# Patient Record
Sex: Male | Born: 1990 | Race: Black or African American | Hispanic: No | Marital: Single | Smoking: Never smoker
Health system: Southern US, Community
[De-identification: ages and names within clinical notes are randomized; demographics above are authoritative.]

## PROBLEM LIST (undated history)

## (undated) HISTORY — PX: MANDIBLE FRACTURE SURGERY: SHX706

---
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
## 2011-04-23 ENCOUNTER — Emergency Department (HOSPITAL_BASED_OUTPATIENT_CLINIC_OR_DEPARTMENT_OTHER)
Admission: EM | Admit: 2011-04-23 | Discharge: 2011-04-23 | Disposition: A | Discharge status: Home / Self Care | Payer: Self-pay | Attending: Emergency Medicine | Admitting: Emergency Medicine

## 2011-04-23 DIAGNOSIS — J069 Acute upper respiratory infection, unspecified: Secondary | ICD-10-CM | POA: Insufficient documentation

## 2011-04-23 DIAGNOSIS — B309 Viral conjunctivitis, unspecified: Secondary | ICD-10-CM | POA: Insufficient documentation

## 2011-04-23 MED ORDER — ERYTHROMYCIN 5 MG/GM OP OINT
TOPICAL_OINTMENT | Freq: Four times a day (QID) | OPHTHALMIC | Status: DC
  Administered 2011-04-23: 23:00:00 via OPHTHALMIC

## 2011-04-23 NOTE — ED Notes (Signed)
C/o sorethorat, runnny nose, cough, d/c from eyes x 3 days-NAD
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
## 2011-04-23 NOTE — ED Notes (Signed)
Pt given vial or erythromycin ointment TO GO and instructd on usage.

## 2011-04-23 NOTE — ED Provider Notes (Signed)
History     Chief Complaint  Patient presents with  . URI   The history is provided by the patient.  HPI This is a 20 year old male with a three-day history of upper respiratory infection symptoms. Specifically he has had eye redness and irritation, nasal congestion, nonproductive cough when he wakes up in the mornings. The symptoms in general or mild except for the eye symptoms which are moderate. He used an entire bottle of Visine at one time in an attempt to treat his eyes; this helped transiently. He had a subjective fever the first day but none since. History reviewed. No pertinent past medical history.  Past Surgical History  Procedure Date  . Mandible fracture surgery     No family history on file.  History  Substance Use Topics  . Smoking status: Never Smoker   . Smokeless tobacco: Not on file  . Alcohol Use: Yes      Review of Systems  All other systems reviewed and are negative.    Physical Exam  BP 109/67  Pulse 65  Temp(Src) 98.9 F (37.2 C) (Oral)  Resp 16  Ht 5\' 7"  (1.702 m)  Wt 162 lb (73.483 kg)  BMI 25.37 kg/m2  SpO2 100%  Physical Exam General: Well-developed, well-nourished male in no acute distress; appearance consistent with age of record HENT: normocephalic, atraumatic; no pharyngeal erythema or exudate Eyes: pupils equal round and reactive to light; extraocular muscles intact; conjunctival inflammation with serous discharge bilaterally Neck: supple Heart: regular rate and rhythm; no murmurs, rubs or gallops Lungs: clear to auscultation bilaterally Abdomen: soft; nontender; nondistended; no masses or hepatosplenomegaly; bowel sounds present Extremities: No deformity; full range of motion; pulses normal Neurologic: Awake, alert and oriented;motor function intact in all extremities and symmetric;sensation grossly intact; no facial droop Skin: Warm and dry Lymph: Left anterior cervical lymphadenopathy Psychiatric: Normal mood and  affect    ED Course  Procedures  MDM Patient is here primarily for his ophthalmologic symptoms. We will treat with antibiotic ointment. We have advised Visine used according to instructions or saline drops as needed. He was advised to use such drops prior to applying the ointment.      Hanley Seamen, MD 04/23/11 2312

## 2011-06-09 ENCOUNTER — Emergency Department (HOSPITAL_COMMUNITY)
Admission: EM | Admit: 2011-06-09 | Discharge: 2011-06-09 | Disposition: A | Discharge status: Home / Self Care | Payer: Self-pay | Attending: Emergency Medicine | Admitting: Emergency Medicine

## 2011-06-09 DIAGNOSIS — IMO0002 Reserved for concepts with insufficient information to code with codable children: Secondary | ICD-10-CM | POA: Insufficient documentation

## 2011-06-09 DIAGNOSIS — S20229A Contusion of unspecified back wall of thorax, initial encounter: Secondary | ICD-10-CM | POA: Insufficient documentation

## 2012-10-04 ENCOUNTER — Emergency Department (HOSPITAL_COMMUNITY)
Admission: EM | Admit: 2012-10-04 | Discharge: 2012-10-04 | Disposition: A | Discharge status: Home / Self Care | Payer: Self-pay | Attending: Emergency Medicine | Admitting: Emergency Medicine

## 2012-10-04 ENCOUNTER — Encounter (HOSPITAL_COMMUNITY): Payer: Self-pay | Admitting: *Deleted

## 2012-10-04 ENCOUNTER — Emergency Department (HOSPITAL_COMMUNITY): Payer: Self-pay

## 2012-10-04 DIAGNOSIS — Y939 Activity, unspecified: Secondary | ICD-10-CM | POA: Insufficient documentation

## 2012-10-04 DIAGNOSIS — Z23 Encounter for immunization: Secondary | ICD-10-CM | POA: Insufficient documentation

## 2012-10-04 DIAGNOSIS — T07XXXA Unspecified multiple injuries, initial encounter: Secondary | ICD-10-CM

## 2012-10-04 DIAGNOSIS — Y929 Unspecified place or not applicable: Secondary | ICD-10-CM | POA: Insufficient documentation

## 2012-10-04 DIAGNOSIS — S0990XA Unspecified injury of head, initial encounter: Secondary | ICD-10-CM | POA: Insufficient documentation

## 2012-10-04 DIAGNOSIS — IMO0002 Reserved for concepts with insufficient information to code with codable children: Secondary | ICD-10-CM | POA: Insufficient documentation

## 2012-10-04 MED ORDER — TETANUS-DIPHTH-ACELL PERTUSSIS 5-2.5-18.5 LF-MCG/0.5 IM SUSP
0.5000 mL | Freq: Once | INTRAMUSCULAR | Status: AC
  Administered 2012-10-04: 0.5 mL via INTRAMUSCULAR

## 2012-10-04 MED ORDER — TETANUS-DIPHTH-ACELL PERTUSSIS 5-2.5-18.5 LF-MCG/0.5 IM SUSP
INTRAMUSCULAR | Status: AC
  Filled 2012-10-04: qty 0.5

## 2012-10-04 MED ORDER — IBUPROFEN 400 MG PO TABS
600.0000 mg | ORAL_TABLET | Freq: Once | ORAL | Status: AC
  Administered 2012-10-04: 600 mg via ORAL
  Filled 2012-10-04: qty 2

## 2012-10-04 MED ORDER — OXYCODONE-ACETAMINOPHEN 5-325 MG PO TABS
1.0000 | ORAL_TABLET | Freq: Once | ORAL | Status: AC
  Administered 2012-10-04: 1 via ORAL
  Filled 2012-10-04: qty 1
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
## 2012-10-04 NOTE — ED Notes (Signed)
ED MD at bedside. C-collar removed

## 2012-10-04 NOTE — ED Notes (Signed)
Patient transported to CT 
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
## 2013-07-09 ENCOUNTER — Emergency Department (HOSPITAL_COMMUNITY)
Admission: EM | Admit: 2013-07-09 | Discharge: 2013-07-09 | Disposition: A | Discharge status: Home / Self Care | Payer: Self-pay | Attending: Emergency Medicine | Admitting: Emergency Medicine

## 2013-07-09 ENCOUNTER — Encounter (HOSPITAL_COMMUNITY): Payer: Self-pay | Admitting: Emergency Medicine

## 2013-07-09 ENCOUNTER — Emergency Department (HOSPITAL_COMMUNITY): Payer: Self-pay

## 2013-07-09 DIAGNOSIS — Z23 Encounter for immunization: Secondary | ICD-10-CM | POA: Insufficient documentation

## 2013-07-09 DIAGNOSIS — Y939 Activity, unspecified: Secondary | ICD-10-CM | POA: Insufficient documentation

## 2013-07-09 DIAGNOSIS — S81812A Laceration without foreign body, left lower leg, initial encounter: Secondary | ICD-10-CM

## 2013-07-09 DIAGNOSIS — W298XXA Contact with other powered powered hand tools and household machinery, initial encounter: Secondary | ICD-10-CM | POA: Insufficient documentation

## 2013-07-09 DIAGNOSIS — S81009A Unspecified open wound, unspecified knee, initial encounter: Secondary | ICD-10-CM | POA: Insufficient documentation

## 2013-07-09 DIAGNOSIS — Y929 Unspecified place or not applicable: Secondary | ICD-10-CM | POA: Insufficient documentation

## 2013-07-09 MED ORDER — TETANUS-DIPHTH-ACELL PERTUSSIS 5-2.5-18.5 LF-MCG/0.5 IM SUSP
0.5000 mL | Freq: Once | INTRAMUSCULAR | Status: AC
  Administered 2013-07-09: 0.5 mL via INTRAMUSCULAR
  Filled 2013-07-09: qty 0.5

## 2013-07-09 MED ORDER — HYDROCODONE-ACETAMINOPHEN 5-325 MG PO TABS: 1.0000 | ORAL_TABLET | ORAL | Status: DC | PRN

## 2013-07-09 MED ORDER — HYDROCODONE-ACETAMINOPHEN 5-325 MG PO TABS
1.0000 | ORAL_TABLET | Freq: Once | ORAL | Status: AC
  Administered 2013-07-09: 1 via ORAL
  Filled 2013-07-09: qty 1

## 2013-07-09 NOTE — ED Notes (Signed)
Dr. Ranae Palms at the bedside. Dressings removed from left medial aspect of mid calf and foot. Abrasion with superficial lacerations to medial aspect of left foot. Moderate bleeding from left calf. Wet to dry dressing applied by tech at this time.

## 2013-07-09 NOTE — ED Notes (Signed)
Pt reports that he was cutting limbs in a tree and slipped. Reports that the chainsaw hit him on the left calf. Pt with bleeding at triage. Pt reports that the cables he was wearing stopped him from falling very far. Pt reports sensation in the foot.

## 2013-07-09 NOTE — ED Notes (Signed)
Pt. Desires no add. Pain med. Has a high pain tolerance.

## 2013-07-09 NOTE — ED Notes (Signed)
Resident at bedside to suture wounds.

## 2013-07-09 NOTE — ED Provider Notes (Signed)
CSN: [id number]     Arrival date & time 07/09/13  1754 History   First MD Initiated Contact with Patient 07/09/13 1808     Chief Complaint  Patient presents with  . Laceration   (Consider location/radiation/quality/duration/timing/severity/associated sxs/prior Treatment) HPI The patient sustained a laceration to his left lower leg today after dropping a chain saw. The injury occurred roughly 6 hours prior to presentation. He continues to have some bleeding but the wound is now clotted. He denies any sensory loss or decreased movement of his lower extremity. He is unsure of his last tetanus shot. He denies any head or neck trauma. History reviewed. No pertinent past medical history. Past Surgical History  Procedure Laterality Date  . Mandible fracture surgery     History reviewed. No pertinent family history. History  Substance Use Topics  . Smoking status: Never Smoker   . Smokeless tobacco: Not on file  . Alcohol Use: Yes    Review of Systems  Constitutional: Negative for fever and chills.  Eyes: Negative for visual disturbance.  Respiratory: Negative for shortness of breath.   Cardiovascular: Negative for chest pain.  Gastrointestinal: Negative for nausea, vomiting, abdominal pain and diarrhea.  Musculoskeletal: Negative for back pain, myalgias and neck pain.  Skin: Positive for wound.  Neurological: Negative for dizziness, syncope, weakness, light-headedness, numbness and headaches.  All other systems reviewed and are negative.    Allergies  Review of patient's allergies indicates no known allergies.  Home Medications  No current outpatient prescriptions on file. BP 125/80  Pulse 76  Temp(Src) 98.5 F (36.9 C) (Oral)  Resp 16  SpO2 98% Physical Exam  Nursing note and vitals reviewed. Constitutional: He is oriented to person, place, and time. He appears well-developed and well-nourished. No distress.  HENT:  Head: Normocephalic and atraumatic.  Mouth/Throat:  Oropharynx is clear and moist.  Eyes: EOM are normal. Pupils are equal, round, and reactive to light.  Neck: Normal range of motion. Neck supple.  No posterior cervical spine tenderness to palpation.  Cardiovascular: Normal rate and regular rhythm.   Pulmonary/Chest: Effort normal and breath sounds normal. No respiratory distress. He has no wheezes. He has no rales.  Abdominal: Soft. Bowel sounds are normal. He exhibits no distension and no mass. There is no tenderness. There is no rebound and no guarding.  Musculoskeletal: Normal range of motion. He exhibits no edema and no tenderness.  Dorsalis pedis and posterior tibial pulses are fully intact.  Neurological: He is alert and oriented to person, place, and time.  Good strength in dorsi and plantar flexion of the left foot. Sensation is fully intact. No other motor or sensory deficits.  Skin: Skin is warm and dry. No rash noted. No erythema.  Patient has roughly 6 cm irregular laceration to his medial surface of his left lower extremity approximately the level of the gastrocnemius. It is vertically oriented. Clot has formed though there continues to be some mild bleeding. He has no obvious contamination exam. Distal to the laceration is small amount of the macerated skin on the medial surface of his left foot. No active bleeding or underlying deformity.  Psychiatric: He has a normal mood and affect. His behavior is normal.    ED Course  Procedures (including critical care time) Labs Review Labs Reviewed - No data to display Imaging Review No results found.  EKG Interpretation   None       MDM    This with no bony injury on x-ray. Laceration explored  irrigated and repaired by the resident. See resident procedure note. Patient advised to return immediately for any evidence of infection. He should be seen in 24-48 hours for wound check of the area. He also needs to have the sutures removed in 7-10 days. Patient voices  understanding.  Loren Racer, MD 07/09/13 2200

## 2013-08-04 ENCOUNTER — Emergency Department (HOSPITAL_COMMUNITY)
Admission: EM | Admit: 2013-08-04 | Discharge: 2013-08-04 | Disposition: A | Discharge status: Home / Self Care | Payer: Self-pay | Attending: Emergency Medicine | Admitting: Emergency Medicine

## 2013-08-04 ENCOUNTER — Emergency Department (HOSPITAL_COMMUNITY): Payer: Self-pay

## 2013-08-04 ENCOUNTER — Encounter (HOSPITAL_COMMUNITY): Payer: Self-pay | Admitting: Emergency Medicine

## 2013-08-04 DIAGNOSIS — S62009A Unspecified fracture of navicular [scaphoid] bone of unspecified wrist, initial encounter for closed fracture: Secondary | ICD-10-CM | POA: Insufficient documentation

## 2013-08-04 DIAGNOSIS — IMO0002 Reserved for concepts with insufficient information to code with codable children: Secondary | ICD-10-CM | POA: Insufficient documentation

## 2013-08-04 DIAGNOSIS — S62001A Unspecified fracture of navicular [scaphoid] bone of right wrist, initial encounter for closed fracture: Secondary | ICD-10-CM

## 2013-08-04 DIAGNOSIS — Y9389 Activity, other specified: Secondary | ICD-10-CM | POA: Insufficient documentation

## 2013-08-04 DIAGNOSIS — Y929 Unspecified place or not applicable: Secondary | ICD-10-CM | POA: Insufficient documentation

## 2013-08-04 MED ORDER — HYDROCODONE-ACETAMINOPHEN 5-325 MG PO TABS
2.0000 | ORAL_TABLET | Freq: Once | ORAL | Status: AC
  Administered 2013-08-04: 2 via ORAL
  Filled 2013-08-04: qty 2

## 2013-08-04 MED ORDER — HYDROCODONE-ACETAMINOPHEN 5-325 MG PO TABS: 1.0000 | ORAL_TABLET | Freq: Four times a day (QID) | ORAL | Status: DC | PRN

## 2013-08-04 MED ORDER — IBUPROFEN 800 MG PO TABS
800.0000 mg | ORAL_TABLET | Freq: Once | ORAL | Status: AC
  Administered 2013-08-04: 800 mg via ORAL
  Filled 2013-08-04: qty 1

## 2013-08-04 MED ORDER — IBUPROFEN 600 MG PO TABS: 600.0000 mg | ORAL_TABLET | Freq: Four times a day (QID) | ORAL | Status: AC | PRN
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
## 2013-08-04 NOTE — Progress Notes (Signed)
[facility] provided pt with a list of primary care resources.  °

## 2013-08-06 NOTE — ED Provider Notes (Signed)
Medical screening examination/treatment/procedure(s) were performed by non-physician practitioner and as supervising physician I was immediately available for consultation/collaboration.  EKG Interpretation   None        Milina Pagett, MD 08/06/13 1513 

## 2013-08-12 NOTE — ED Provider Notes (Signed)
I saw and evaluated the patient, reviewed the resident's note and I agree with the findings and plan. I was present and supervised the key portions of the procedure  EKG Interpretation   None        Loren Racer, MD 08/12/13 2344

## 2013-08-12 NOTE — ED Provider Notes (Signed)
LACERATION REPAIR Performed by: Noel Gerold Authorized by: Noel Gerold Consent: Verbal consent obtained. Risks and benefits: risks, benefits and alternatives were discussed Consent given by: patient Patient identity confirmed: provided demographic data Prepped and Draped in normal sterile fashion Wound explored  Laceration Location:L calf:  Laceration Length:6cm As well as 1cm over L heel  No Foreign Bodies seen or palpated  Anesthesia: local infiltration  Local anesthetic: lidocaine 1% with epinephrine  Anesthetic total: 15 ml  Irrigation method: syringe Amount of cleaning: extensive  Skin closure: prolene  Technique: Simple interrupted  Patient tolerance: Patient tolerated the procedure well with no immediate complications.   Procedure note added as late entry due to errantly unentered documentation  Noel Gerold, MD 08/12/13 1057

## 2014-03-17 ENCOUNTER — Emergency Department (HOSPITAL_COMMUNITY): Payer: Self-pay

## 2014-03-17 ENCOUNTER — Encounter (HOSPITAL_COMMUNITY): Payer: Self-pay | Admitting: Emergency Medicine

## 2014-03-17 ENCOUNTER — Emergency Department (HOSPITAL_COMMUNITY)
Admission: EM | Admit: 2014-03-17 | Discharge: 2014-03-17 | Disposition: A | Discharge status: Home / Self Care | Payer: Self-pay | Attending: Emergency Medicine | Admitting: Emergency Medicine

## 2014-03-17 DIAGNOSIS — Y9389 Activity, other specified: Secondary | ICD-10-CM | POA: Insufficient documentation

## 2014-03-17 DIAGNOSIS — S9030XA Contusion of unspecified foot, initial encounter: Secondary | ICD-10-CM | POA: Insufficient documentation

## 2014-03-17 DIAGNOSIS — S9032XA Contusion of left foot, initial encounter: Secondary | ICD-10-CM

## 2014-03-17 DIAGNOSIS — Y92410 Unspecified street and highway as the place of occurrence of the external cause: Secondary | ICD-10-CM | POA: Insufficient documentation

## 2014-03-17 DIAGNOSIS — S92919A Unspecified fracture of unspecified toe(s), initial encounter for closed fracture: Secondary | ICD-10-CM | POA: Insufficient documentation

## 2014-03-17 DIAGNOSIS — T148XXA Other injury of unspecified body region, initial encounter: Secondary | ICD-10-CM

## 2014-03-17 MED ORDER — HYDROCODONE-ACETAMINOPHEN 5-325 MG PO TABS: 1.0000 | ORAL_TABLET | ORAL | Status: DC | PRN

## 2014-03-17 MED ORDER — IBUPROFEN 800 MG PO TABS: 800.0000 mg | ORAL_TABLET | Freq: Three times a day (TID) | ORAL | Status: AC | PRN
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
## 2014-03-17 NOTE — Discharge Instructions (Signed)
Read the information below.  Use the prescribed medication as directed.  Please discuss all new medications with your pharmacist.  Do not take additional tylenol while taking the prescribed pain medication to avoid overdose.  You may return to the Emergency Department at any time for worsening condition or any new symptoms that concern you.  If you develop uncontrolled pain, weakness or numbness of the extremity, severe discoloration of the skin, or you are unable to walk, return to the ER for a recheck.      Foot Contusion A foot contusion is a deep bruise to the foot. Contusions are the result of an injury that caused bleeding under the skin. The contusion may turn blue, purple, or yellow. Minor injuries will give you a painless contusion, but more severe contusions may stay painful and swollen for a few weeks. CAUSES  A foot contusion comes from a direct blow to that area, such as a heavy object falling on the foot. SYMPTOMS   Swelling of the foot.  Discoloration of the foot.  Tenderness or soreness of the foot. DIAGNOSIS  You will have a physical exam and will be asked about your history. You may need an X-ray of your foot to look for a broken bone (fracture).  TREATMENT  An elastic wrap may be recommended to support your foot. Resting, elevating, and applying cold compresses to your foot are often the best treatments for a foot contusion. Over-the-counter medicines may also be recommended for pain control. HOME CARE INSTRUCTIONS   Put ice on the injured area.  Put ice in a plastic bag.  Place a towel between your skin and the bag.  Leave the ice on for 15-20 minutes, 03-04 times a day.  Only take over-the-counter or prescription medicines for pain, discomfort, or fever as directed by your caregiver.  If told, use an elastic wrap as directed. This can help reduce swelling. You may remove the wrap for sleeping, showering, and bathing. If your toes become numb, cold, or blue, take the  wrap off and reapply it more loosely.  Elevate your foot with pillows to reduce swelling.  Try to avoid standing or walking while the foot is painful. Do not resume use until instructed by your caregiver. Then, begin use gradually. If pain develops, decrease use. Gradually increase activities that do not cause discomfort until you have normal use of your foot.  See your caregiver as directed. It is very important to keep all follow-up appointments in order to avoid any lasting problems with your foot, including long-term (chronic) pain. SEEK IMMEDIATE MEDICAL CARE IF:   You have increased redness, swelling, or pain in your foot.  Your swelling or pain is not relieved with medicines.  You have loss of feeling in your foot or are unable to move your toes.  Your foot turns cold or blue.  You have pain when you move your toes.  Your foot becomes warm to the touch.  Your contusion does not improve in 2 days. MAKE SURE YOU:   Understand these instructions.  Will watch your condition.  Will get help right away if you are not doing well or get worse. Document Released: 07/01/2006 Document Revised: 03/10/2012 Document Reviewed: 08/13/2011 [employer] Patient Information 2015 [employer], [place]. This information is not intended to replace advice given to you by your health care provider. Make sure you discuss any questions you have with your health care provider.

## 2014-03-21 NOTE — ED Provider Notes (Signed)
Medical screening examination/treatment/procedure(s) were performed by non-physician practitioner and as supervising physician I was immediately available for consultation/collaboration.   EKG Interpretation None        Mark James, MD 03/21/14 2142 

## 2015-05-10 ENCOUNTER — Emergency Department (HOSPITAL_COMMUNITY)
Admission: EM | Admit: 2015-05-10 | Discharge: 2015-05-10 | Disposition: A | Discharge status: Home / Self Care | Payer: Self-pay | Attending: Emergency Medicine | Admitting: Emergency Medicine

## 2015-05-10 ENCOUNTER — Encounter (HOSPITAL_COMMUNITY): Payer: Self-pay | Admitting: *Deleted

## 2015-05-10 ENCOUNTER — Emergency Department (HOSPITAL_COMMUNITY): Payer: Self-pay

## 2015-05-10 DIAGNOSIS — S60512A Abrasion of left hand, initial encounter: Secondary | ICD-10-CM | POA: Insufficient documentation

## 2015-05-10 DIAGNOSIS — S6992XA Unspecified injury of left wrist, hand and finger(s), initial encounter: Secondary | ICD-10-CM

## 2015-05-10 DIAGNOSIS — Y939 Activity, unspecified: Secondary | ICD-10-CM | POA: Insufficient documentation

## 2015-05-10 DIAGNOSIS — W2209XA Striking against other stationary object, initial encounter: Secondary | ICD-10-CM | POA: Insufficient documentation

## 2015-05-10 DIAGNOSIS — Y929 Unspecified place or not applicable: Secondary | ICD-10-CM | POA: Insufficient documentation

## 2015-05-10 DIAGNOSIS — Y999 Unspecified external cause status: Secondary | ICD-10-CM | POA: Insufficient documentation

## 2015-05-10 MED ORDER — OXYCODONE-ACETAMINOPHEN 5-325 MG PO TABS: 2.0000 | ORAL_TABLET | ORAL | Status: AC | PRN

## 2015-05-10 NOTE — Discharge Instructions (Signed)
Please use ibuprofen, Tylenol, ice as needed for pain. Please avoid any heavy lifting or strenuous activities to require grip strength. Please use medication only as directed, do not use well working, driving, drinking. Please follow-up with primary care provider if symptoms continue to persist or worsen.

## 2015-05-10 NOTE — ED Notes (Signed)
Pt refused dressing at time of discharge, sent home with bandages, ace wrap, bacitracin and ice pack in bag. Pt left at this time with all belongings.

## 2015-05-10 NOTE — ED Notes (Signed)
Patient transported to X-ray 

## 2015-05-10 NOTE — ED Notes (Signed)
Patient returned from xray.
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
## 2016-08-22 ENCOUNTER — Emergency Department (HOSPITAL_COMMUNITY)
Admission: EM | Admit: 2016-08-22 | Discharge: 2016-08-22 | Disposition: A | Discharge status: Home / Self Care | Payer: Self-pay | Attending: Emergency Medicine | Admitting: Emergency Medicine

## 2016-08-22 ENCOUNTER — Encounter (HOSPITAL_COMMUNITY): Payer: Self-pay

## 2016-08-22 ENCOUNTER — Emergency Department (HOSPITAL_COMMUNITY): Payer: Self-pay

## 2016-08-22 DIAGNOSIS — Z79899 Other long term (current) drug therapy: Secondary | ICD-10-CM | POA: Insufficient documentation

## 2016-08-22 DIAGNOSIS — Y999 Unspecified external cause status: Secondary | ICD-10-CM | POA: Insufficient documentation

## 2016-08-22 DIAGNOSIS — Y9389 Activity, other specified: Secondary | ICD-10-CM | POA: Insufficient documentation

## 2016-08-22 DIAGNOSIS — Y929 Unspecified place or not applicable: Secondary | ICD-10-CM | POA: Insufficient documentation

## 2016-08-22 DIAGNOSIS — W208XXA Other cause of strike by thrown, projected or falling object, initial encounter: Secondary | ICD-10-CM | POA: Insufficient documentation

## 2016-08-22 DIAGNOSIS — S52392A Other fracture of shaft of radius, left arm, initial encounter for closed fracture: Secondary | ICD-10-CM | POA: Insufficient documentation

## 2016-08-22 MED ORDER — HYDROCODONE-ACETAMINOPHEN 5-325 MG PO TABS: 1.0000 | ORAL_TABLET | Freq: Four times a day (QID) | ORAL | 0 refills | Status: AC | PRN

## 2016-08-22 NOTE — ED Provider Notes (Signed)
[facility]-EMERGENCY DEPT Provider Note   CSN: [id number] Arrival date & time: 08/22/16  1037     History   Chief Complaint Chief Complaint  Patient presents with  . Arm Pain    HPI [name] is a 25 y.o. male.  Patient cutting tree while elevated in a bucket truck yesterday. Tree fell and struck patient in the left forearm while arm was supported on ledge of bucket. Patient with mid-arm pain. No deformity.    The history is provided by the patient. No language interpreter was used.  Arm Injury   This is a new problem. The current episode started yesterday. The problem has not changed since onset.The pain is present in the left arm. The quality of the pain is described as aching. The pain is mild. Associated symptoms include limited range of motion. There has been a history of trauma.    History reviewed. No pertinent past medical history.  There are no active problems to display for this patient.   Past Surgical History:  Procedure Laterality Date  . MANDIBLE FRACTURE SURGERY         Home Medications    Prior to Admission medications   Medication Sig Start Date End Date Taking? Authorizing Provider  diphenhydrAMINE (BENADRYL) 25 mg capsule Take 25-50 mg by mouth at bedtime as needed for sleep.    Historical Provider, MD  HYDROcodone-acetaminophen (NORCO/VICODIN) 5-325 MG per tablet Take 1-2 tablets by mouth every 6 (six) hours as needed for moderate pain or severe pain. 08/04/13   Junius FinnerErin O'Malley, PA-C  HYDROcodone-acetaminophen (NORCO/VICODIN) 5-325 MG per tablet Take 1 tablet by mouth every 4 (four) hours as needed for moderate pain or severe pain. 03/17/14   Trixie DredgeEmily West, PA-C  ibuprofen (ADVIL,MOTRIN) 600 MG tablet Take 1 tablet (600 mg total) by mouth every 6 (six) hours as needed. 08/04/13   Junius FinnerErin O'Malley, PA-C  ibuprofen (ADVIL,MOTRIN) 800 MG tablet Take 1 tablet (800 mg total) by mouth every 8 (eight) hours as needed for mild pain or moderate pain. 03/17/14   Trixie DredgeEmily  West, PA-C  oxyCODONE-acetaminophen (PERCOCET/ROXICET) 5-325 MG per tablet Take 2 tablets by mouth every 4 (four) hours as needed for severe pain. 05/10/15   Eyvonne MechanicJeffrey Hedges, PA-C    Family History History reviewed. No pertinent family history.  Social History Social History  Substance Use Topics  . Smoking status: Never Smoker  . Smokeless tobacco: Never Used  . Alcohol use Yes     Allergies   Patient has no known allergies.   Review of Systems Review of Systems  Musculoskeletal: Positive for arthralgias.  All other systems reviewed and are negative.    Physical Exam Updated Vital Signs BP 123/73 (BP Location: Right Arm)   Pulse 85   Temp 98.3 F (36.8 C) (Oral)   Resp 14   Ht 5\' 8"  (1.727 m)   Wt 73.5 kg   SpO2 98%   BMI 24.63 kg/m   Physical Exam  Constitutional: He is oriented to person, place, and time. He appears well-developed and well-nourished.  Eyes: Conjunctivae are normal.  Neck: Neck supple.  Cardiovascular: Normal rate and regular rhythm.   Pulmonary/Chest: Effort normal and breath sounds normal.  Abdominal: Soft. Bowel sounds are normal.  Musculoskeletal: He exhibits tenderness.       Arms: Neurological: He is alert and oriented to person, place, and time.  Skin: Skin is warm and dry. Capillary refill takes less than 2 seconds.  Psychiatric: He has a normal mood and  affect.  Nursing note and vitals reviewed.    ED Treatments / Results  Labs (all labs ordered are listed, but only abnormal results are displayed) Labs Reviewed - No data to display  EKG  EKG Interpretation None       Radiology Dg Forearm Left  Result Date: 08/22/2016 CLINICAL DATA:  Injured operating chainsaw last night with pain in the mid forearm EXAM: LEFT FOREARM - 2 VIEW COMPARISON:  None. FINDINGS: There is a nondisplaced fracture through the mid distal left radius. The ulna is intact. No opacity within the soft tissues is seen. IMPRESSION: Nondisplaced transverse  fracture through the mid distal left radius. Electronically Signed   By: Dwyane DeePaul  Barry M.D.   On: 08/22/2016 11:35    Procedures Procedures (including critical care time)  Medications Ordered in ED Medications - No data to display   Initial Impression / Assessment and Plan / ED Course  I have reviewed the triage vital signs and the nursing notes.  Pertinent labs & imaging results that were available during my care of the patient were reviewed by me and considered in my medical decision making (see chart for details).  Clinical Course   Patient X-Ray positive for mid-shaft left radius fracture. Splinted in the ED.  Pt advised to follow up with orthopedics. Care instructions provided. Patient will be discharged home & is agreeable with above plan. Returns precautions discussed. Pt appears safe for discharge.    Final Clinical Impressions(s) / ED Diagnoses   Final diagnoses:  Other closed fracture of shaft of left radius, initial encounter    New Prescriptions Discharge Medication List as of 08/22/2016  1:05 PM       Felicie Mornavid Maxcine Strong, NP 08/22/16 1617    Laurence Spatesachel Morgan Little, MD 08/23/16 [phone]

## 2016-08-22 NOTE — ED Notes (Signed)
Bed: WTR8 Expected date:  Expected time:  Means of arrival:  Comments: 

## 2016-08-22 NOTE — ED Triage Notes (Signed)
Pt was using chainsaw yesterday.  Ran out of gas.  Arm was pinned in hard spot.  Pain in left forearm.  Able to move fingers.  No obvious deformity noted.  States pain is better with elevation.

## 2016-09-03 ENCOUNTER — Emergency Department (HOSPITAL_COMMUNITY)
Admission: EM | Admit: 2016-09-03 | Discharge: 2016-09-03 | Disposition: A | Discharge status: Home / Self Care | Payer: Self-pay | Attending: Emergency Medicine | Admitting: Emergency Medicine

## 2016-09-03 ENCOUNTER — Emergency Department (HOSPITAL_COMMUNITY): Payer: Self-pay

## 2016-09-03 DIAGNOSIS — S52502D Unspecified fracture of the lower end of left radius, subsequent encounter for closed fracture with routine healing: Secondary | ICD-10-CM | POA: Insufficient documentation

## 2016-09-03 DIAGNOSIS — W228XXD Striking against or struck by other objects, subsequent encounter: Secondary | ICD-10-CM | POA: Insufficient documentation

## 2016-09-03 NOTE — ED Notes (Signed)
Pt states that he broke his arm and was seen here on 11/30. [facility] and said that he needed to go to hospital and have nurse/doctor page Dr. Amanda PeaGramig regarding having plates and screws placed in his arm.

## 2016-09-03 NOTE — Consult Note (Signed)
Reason for Consult: Forearm fracture Referring Physician: ER staff  Clide DalesQuame R Sorci is an 25 y.o. male.  HPI: 1927 rolled male status post injury November 30. The patient sustained a blow to his left forearm with a nondisplaced radius fracture and a normal styloid fracture. The patient is been in a sugar tong splint. He is now 2 weeks out and is here for evaluation.  I reviewed all issues at length.  This is a very active young man aged 25 he does not smoke or drink. He is very healthy and has mild pain at best in the form of present time.    No past medical history on file.  Past Surgical History:  Procedure Laterality Date  . MANDIBLE FRACTURE SURGERY      No family history on file.  Social History:  reports that he has never smoked. He has never used smokeless tobacco. He reports that he drinks alcohol. He reports that he does not use drugs.  Allergies: No Known Allergies  Medications: I have reviewed the patient's current medications.  No results found for this or any previous visit (from the past 48 hour(s)).  Dg Forearm Left  Result Date: 09/03/2016 CLINICAL DATA:  History of left radial fracture. EXAM: LEFT FOREARM - 2 VIEW COMPARISON:  08/22/2016 FINDINGS: Since the prior radiographs, there is no significant interval healing of the nondisplaced diaphyseal fracture of the left radius. There is no significant angulation at the level of fracture. Minimally displaced avulsion fracture of the ulnar styloid and also again visible. IMPRESSION: No significant interval healing of the nondisplaced left radial fracture. Minimally displaced avulsion fracture of the ulnar styloid also present. Electronically Signed   By: Irish LackGlenn  Yamagata M.D.   On: 09/03/2016 17:30    Review of Systems  HENT: Negative.   Eyes: Negative.   Respiratory: Negative.   Cardiovascular: Negative.   Gastrointestinal: Negative.   Genitourinary: Negative.   Endo/Heme/Allergies: Negative.    Psychiatric/Behavioral: Negative.    Blood pressure 116/80, pulse 70, temperature 98 F (36.7 C), temperature source Oral, resp. rate 16, height 5\' 8"  (1.727 m), weight 73.5 kg (162 lb), SpO2 100 %. Physical Exam Alert and oriented in no acute distress file signs are stable.  Left arm is stable he does not have any abnormalities to include compartment syndrome dystrophy infection he has full sensation to the elbow wrist and forearm. Elbow motion and wrist motion are fairly nontender he has normal extension and flexion of the fingers. No upper shoulder pain or mid humerus pain is noted. He is neurovascularly intact.   The patient is alert and oriented in no acute distress. The patient complains of pain in the affected upper extremity.  The patient is noted to have a normal HEENT exam. Lung fields show equal chest expansion and no shortness of breath. Abdomen exam is nontender without distention. Lower extremity examination does not show any fracture dislocation or blood clot symptoms. Pelvis is stable and the neck and back are stable and nontender. Assessment/Plan: Left radius shaft fracture nondisplaced and associated left distal ulna styloid fracture  I discussed the patient the findings. These are nondisplaced fractures and have her main nondisplaced every 2 weeks. I discussed him options of surgical ORIF versus attempted closed treatment. I tried to give him our best estimates on timeframe duration for healing and other issues  At present juncture given his nondisplaced nature and the fact that is not moved out of place would like to consider an attempt a closed  treatment out of rhythm.  Thus I performed long-arm casting today which he tolerated well. 3 point mold was placed and there were no comp caring features.  I like see me in 2-2-1/2 weeks. If he moves one would certainly intervene with surgical intervention. However I do feel that given his young age and healthy living status  that he has a good opportunity to have this heal.  He understands that if he goes on to nonunion he would meet the same fate of ORIF which we could pursue at present time however if he heals in on his own accord this will save him a lot of time in her GM expense. I recommended vitamin D 1200 mg a day and 1000 international units of calcium a day.  He'll call to see me in 2 weeks.    Karen ChafeGRAMIG III,Dale Ribeiro M 09/03/2016, 6:40 PM

## 2016-09-03 NOTE — ED Triage Notes (Signed)
Pt arrives to ED with left arm injury in a split that he states is having surgery with MD gramig today. No notes or orders were able to be found for this pt.

## 2016-09-03 NOTE — Progress Notes (Signed)
Orthopedic Tech Progress Note Patient Details:  Jeremiah DalesQuame R Salvaggio 1990/12/24 [id number]  Casting Type of Cast: Long arm cast Cast Location: LUE Cast Material: Fiberglass Cast Intervention: Application     Jennye MoccasinHughes, Melvina Pangelinan Craig 09/03/2016, 6:38 PM

## 2016-09-03 NOTE — ED Provider Notes (Signed)
[facility] Dr. Amanda PeaGramig.  Patient with wrist fracture from 08/22/17. Contacted Dr. Amanda PeaGramig, who requested repeat forearm films to assess stability--he will be in to see patient and discuss treatment options.  Patient seen by Dr. Amanda PeaGramig. Long arm cast applied by ortho tech.  Follow-up in the clinic in 2 weeks.   Felicie Mornavid Jaison Petraglia, NP 09/04/16 [id number]    Linwood DibblesJon Knapp, MD 09/04/16 1256

## 2017-10-27 ENCOUNTER — Emergency Department (HOSPITAL_COMMUNITY)
Admission: EM | Admit: 2017-10-27 | Discharge: 2017-10-27 | Disposition: A | Discharge status: Home / Self Care | Payer: Self-pay | Attending: Emergency Medicine | Admitting: Emergency Medicine

## 2017-10-27 ENCOUNTER — Other Ambulatory Visit: Payer: Self-pay

## 2017-10-27 ENCOUNTER — Encounter (HOSPITAL_COMMUNITY): Payer: Self-pay | Admitting: *Deleted

## 2017-10-27 DIAGNOSIS — J020 Streptococcal pharyngitis: Secondary | ICD-10-CM | POA: Insufficient documentation

## 2017-10-27 LAB — RAPID STREP SCREEN (MED CTR MEBANE ONLY): Streptococcus, Group A Screen (Direct): POSITIVE — AB

## 2017-10-27 MED ORDER — PENICILLIN G BENZATHINE & PROC 1200000 UNIT/2ML IM SUSP
1.2000 10*6.[IU] | Freq: Once | INTRAMUSCULAR | Status: AC
  Administered 2017-10-27: 1.2 10*6.[IU] via INTRAMUSCULAR
  Filled 2017-10-27: qty 2

## 2017-10-27 MED ORDER — LIDOCAINE VISCOUS 2 % MT SOLN
15.0000 mL | Freq: Once | OROMUCOSAL | Status: AC
  Administered 2017-10-27: 15 mL via OROMUCOSAL
  Filled 2017-10-27: qty 15

## 2017-10-27 MED ORDER — KETOROLAC TROMETHAMINE 30 MG/ML IJ SOLN
30.0000 mg | Freq: Once | INTRAMUSCULAR | Status: AC
  Administered 2017-10-27: 30 mg via INTRAMUSCULAR
  Filled 2017-10-27: qty 1

## 2017-10-27 MED ORDER — DEXAMETHASONE 1 MG/ML PO CONC
10.0000 mg | Freq: Once | ORAL | Status: AC
  Administered 2017-10-27: 10 mg via ORAL
  Filled 2017-10-27: qty 10

## 2017-10-27 NOTE — ED Triage Notes (Signed)
Pt reports having sore throat for several days, states he is having difficulty swallowing for several days and feel dehydrated. Able to speak in full sentences and no resp distress is noted.

## 2017-10-27 NOTE — Discharge Instructions (Signed)
You were treated with a shot of antibiotics. This is a one time dose and you do not need continued antibiotics. You were also treated with a long acting steroid and pain/anti-inflammatory medication. You can continue to take ibuprofen 600 mg every 6 hours as needed for pain. Try to drink plenty of fluids.

## 2017-10-27 NOTE — ED Provider Notes (Signed)
[facility] EMERGENCY DEPARTMENT Provider Note   CS[facility] [id number] Arrival date & time: 10/27/17  [id number]     History   Chief Complaint Chief Complaint  Patient presents with  . Sore Throat    HPI Clide DalesQuame R Knebel is a 27 y.o. male.  HPI   27 year old male with sore throat.  Symptom onset 2 days ago and progressively worsening.  Pain with swallowing and pain in bilateral ears.  Subjective fever, body aches and headache.  No cough.  No rash.  He feels like he is dehydrated.  Denies any dizziness or lightheadedness.  Does report decreased p.o. intake secondary to throat pain.  History reviewed. No pertinent past medical history.  There are no active problems to display for this patient.   Past Surgical History:  Procedure Laterality Date  . MANDIBLE FRACTURE SURGERY         Home Medications    Prior to Admission medications   Medication Sig Start Date End Date Taking? Authorizing Provider  diphenhydrAMINE (BENADRYL) 25 mg capsule Take 25-50 mg by mouth at bedtime as needed for sleep.    [provider]  HYDROcodone-acetaminophen (NORCO/VICODIN) 5-325 MG tablet Take 1 tablet by mouth every 6 (six) hours as needed for severe pain. 08/22/16   Felicie MornSmith, David, NP  ibuprofen (ADVIL,MOTRIN) 600 MG tablet Take 1 tablet (600 mg total) by mouth every 6 (six) hours as needed. 08/04/13   Lurene ShadowPhelps, Erin O, PA-C  ibuprofen (ADVIL,MOTRIN) 800 MG tablet Take 1 tablet (800 mg total) by mouth every 8 (eight) hours as needed for mild pain or moderate pain. 03/17/14   Trixie DredgeWest, Emily, PA-C  oxyCODONE-acetaminophen (PERCOCET/ROXICET) 5-325 MG per tablet Take 2 tablets by mouth every 4 (four) hours as needed for severe pain. 05/10/15   Eyvonne MechanicHedges, Jeffrey, PA-C    Family History History reviewed. No pertinent family history.  Social History Social History   Tobacco Use  . Smoking status: Never Smoker  . Smokeless tobacco: Never Used  Substance Use Topics  . Alcohol use: Yes  .  Drug use: No     Allergies   Patient has no known allergies.   Review of Systems Review of Systems  All systems reviewed and negative, other than as noted in HPI.  Physical Exam Updated Vital Signs BP (!) 95/57 (BP Location: Right Arm)   Pulse 73   Temp 99.3 F (37.4 C) (Oral)   Resp 18   SpO2 99%   Physical Exam  Constitutional: He appears well-developed and well-nourished. No distress.  HENT:  Head: Normocephalic and atraumatic.  Sitting up in bed.  No acute distress.  Exudative pharyngitis.  Uvula is midline.  Normal sounding phonation.  Neck is supple.  Tender right cervical adenopathy.  Submental tissues are soft.    Eyes: Conjunctivae are normal. Right eye exhibits no discharge. Left eye exhibits no discharge.  Neck: Neck supple.  Cardiovascular: Normal rate, regular rhythm and normal heart sounds. Exam reveals no gallop and no friction rub.  No murmur heard. Pulmonary/Chest: Effort normal and breath sounds normal. No respiratory distress.  Abdominal: Soft. He exhibits no distension. There is no tenderness.  Musculoskeletal: He exhibits no edema or tenderness.  Neurological: He is alert.  Skin: Skin is warm and dry.  Psychiatric: He has a normal mood and affect. His behavior is normal. Thought content normal.  Nursing note and vitals reviewed.    ED Treatments / Results  Labs (all labs ordered are listed, but only abnormal results are  displayed) Labs Reviewed  RAPID STREP SCREEN (NOT AT [facility]) - Abnormal; Notable for the following components:      Result Value   Streptococcus, Group A Screen (Direct) POSITIVE (*)    All other components within normal limits    EKG  EKG Interpretation None       Radiology No results found.  Procedures Procedures (including critical care time)  Medications Ordered in ED Medications  penicillin g procaine-penicillin g benzathine (BICILLIN-CR) injection 600000-600000 units (not administered)  lidocaine (XYLOCAINE) 2  % viscous mouth solution 15 mL (not administered)  dexamethasone (DECADRON) 1 MG/ML solution 10 mg (not administered)  ketorolac (TORADOL) 30 MG/ML injection 30 mg (not administered)     Initial Impression / Assessment and Plan / ED Course  I have reviewed the triage vital signs and the nursing notes.  Pertinent labs & imaging results that were available during my care of the patient were reviewed by me and considered in my medical decision making (see chart for details).     27 year old male with exudative pharyngitis and positive rapid strep.  No significant Cerner of serious deep space head/neck infection at this time.  I feel is appropriate for outpatient treatment.  Dose of Bicillin.  Reduce Decadron in the ED.  Continued NSAIDs as needed for pain.  Push fluids.  Return precautions discussed.  Final Clinical Impressions(s) / ED Diagnoses   Final diagnoses:  Strep pharyngitis    ED Discharge Orders    None       Raeford Razor, MD 10/28/17 [phone]

## 2018-08-31 IMAGING — DX DG FOREARM 2V*L*
2 series · 2 of 2 positions shown · non-contrast
Comparison: 08/22/2016

CLINICAL DATA: History of left radial fracture.

EXAM:
LEFT FOREARM - 2 VIEW

[x forearm lat left]
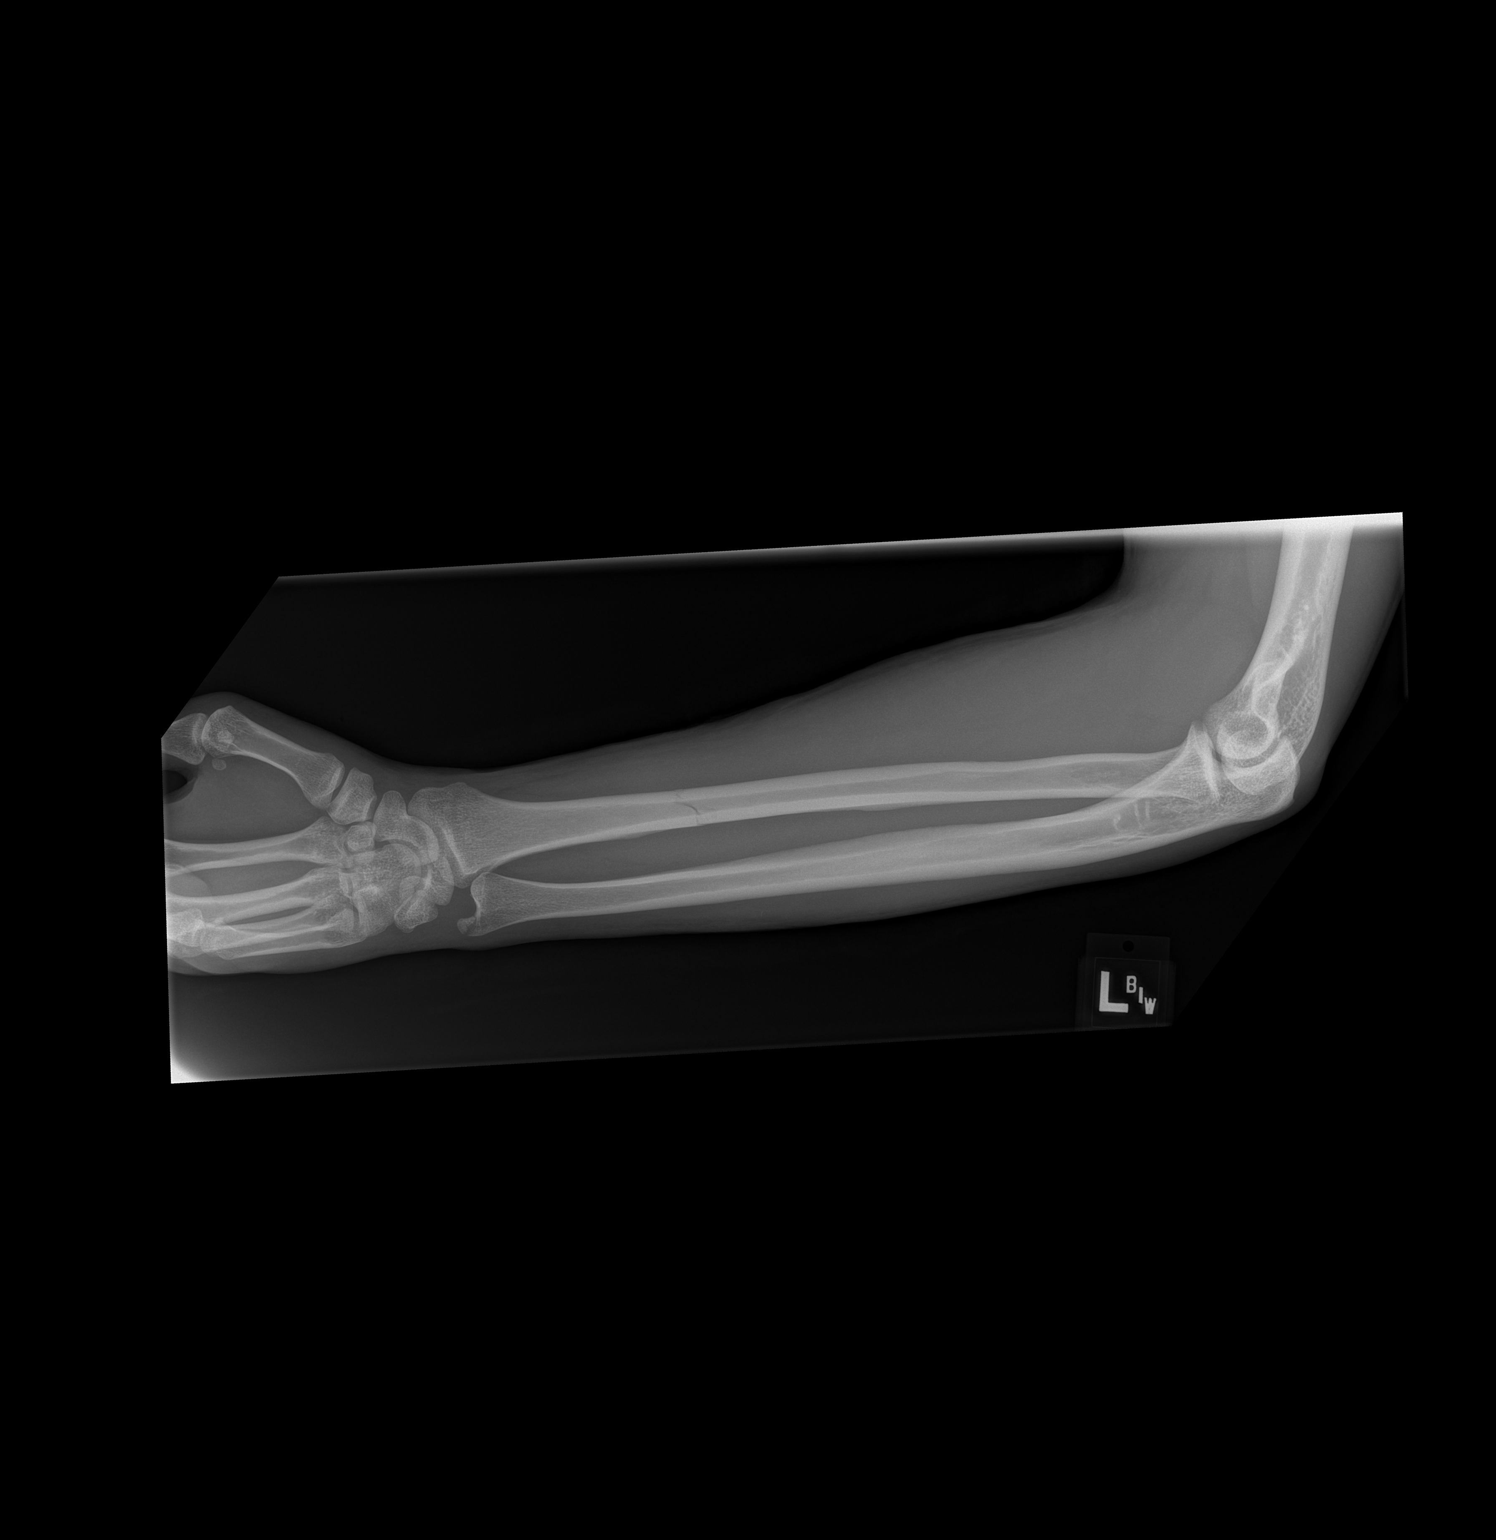

[x forearm ap left]
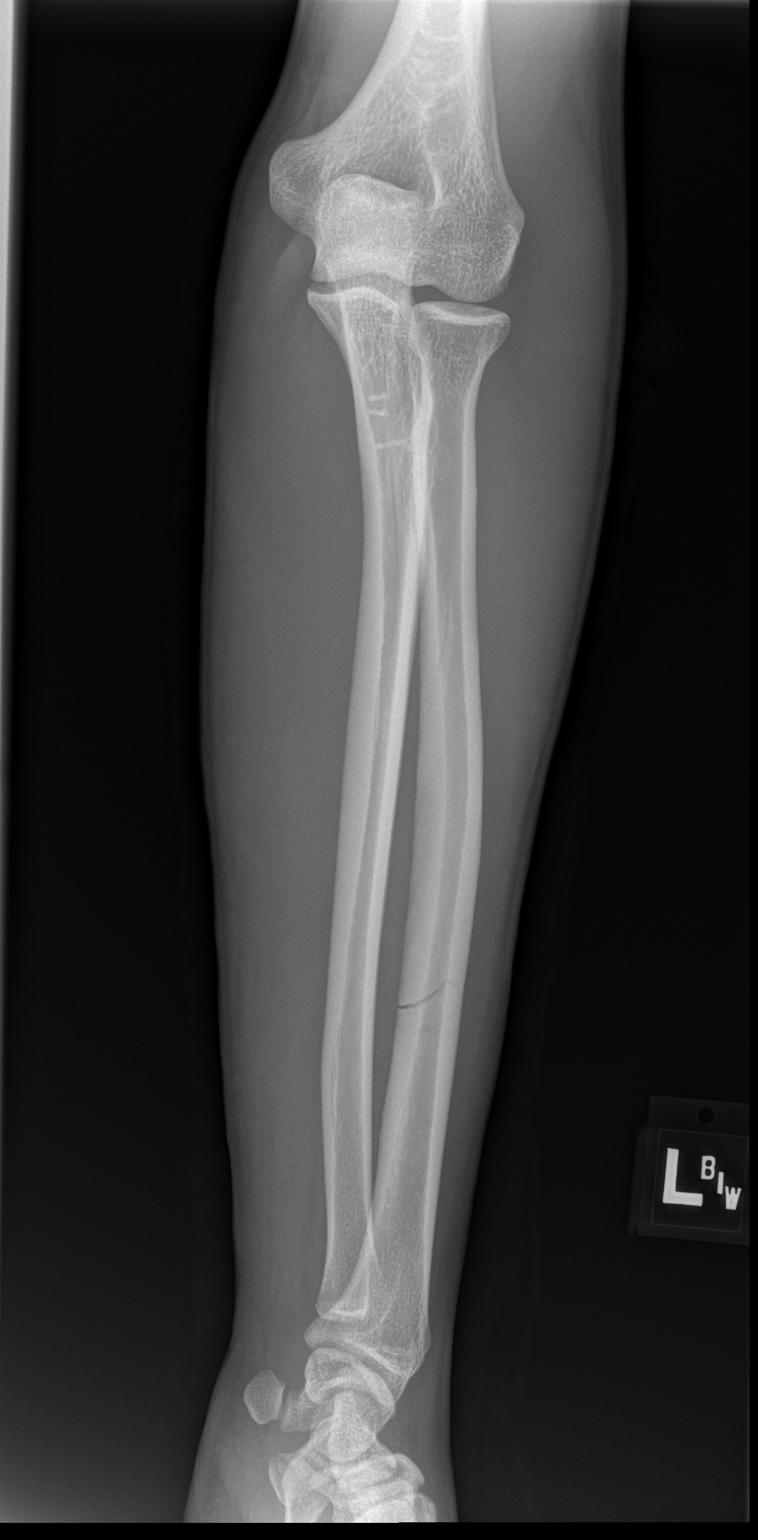

[2 of 2 positions shown; findings below may reference images not displayed]

FINDINGS: Since the prior radiographs, there is no significant interval
healing of the nondisplaced diaphyseal fracture of the left radius.
There is no significant angulation at the level of fracture.
Minimally displaced avulsion fracture of the ulnar styloid and also
again visible.
IMPRESSION: No significant interval healing of the nondisplaced left radial
fracture. Minimally displaced avulsion fracture of the ulnar styloid
also present.

## 2019-05-04 ENCOUNTER — Emergency Department (HOSPITAL_COMMUNITY)
Admission: EM | Admit: 2019-05-04 | Discharge: 2019-05-04 | Disposition: A | Discharge status: Home / Self Care | Payer: Self-pay | Attending: Emergency Medicine | Admitting: Emergency Medicine

## 2019-05-04 ENCOUNTER — Encounter (HOSPITAL_COMMUNITY): Payer: Self-pay

## 2019-05-04 ENCOUNTER — Other Ambulatory Visit: Payer: Self-pay

## 2019-05-04 DIAGNOSIS — L02211 Cutaneous abscess of abdominal wall: Secondary | ICD-10-CM | POA: Insufficient documentation

## 2019-05-04 DIAGNOSIS — L0291 Cutaneous abscess, unspecified: Secondary | ICD-10-CM

## 2019-05-04 MED ORDER — DOXYCYCLINE HYCLATE 100 MG PO CAPS
100.0000 mg | ORAL_CAPSULE | Freq: Two times a day (BID) | ORAL | 0 refills | Status: AC
Start: 2019-05-04 — End: 2019-05-11

## 2019-05-04 MED ORDER — LIDOCAINE-EPINEPHRINE (PF) 2 %-1:200000 IJ SOLN
10.0000 mL | Freq: Once | INTRAMUSCULAR | Status: AC
  Administered 2019-05-04: 10 mL
  Filled 2019-05-04: qty 10

## 2019-05-04 NOTE — Discharge Instructions (Addendum)
Take antibiotics as prescribed.  Take entire course, even if her symptoms improve. Wash daily with soap and water.  Otherwise, keep covered and clean.  Do not poke at, irritate, squeeze, or mess with the otherwise. Use ice to help with pain and swelling. Use warm compresses to help drain.  Use Tylenol and ibuprofen as needed for pain. Return to the emergency room if you develop high fevers, severe worsening pain, increasing pus draining from the area, or any new, worsening, concerning symptoms.

## 2019-05-04 NOTE — ED Triage Notes (Signed)
Pt reports abscess to RLQ, states he is concerned that this could be a spider bite.

## 2019-05-04 NOTE — ED Provider Notes (Signed)
[facility]-EMERGENCY DEPT Provider Note   CSN: [id number] Arrival date & time: 05/04/19  1204     History   Chief Complaint Chief Complaint  Patient presents with   Abscess    HPI [name] is a 28 y.o. male presenting for evaluation of skin lesion.  Patient states that the past 4 days he has had a skin lesion on his abdomen which is gradually becoming more painful and is getting larger.  He reports trying to pop it with no improvement.  Minimal to no drainage.  He has not taken anything for it including Tylenol or ibuprofen.  He denies fevers, chills, nausea, vomiting.  He denies history of similar.  No history of diabetes.  He is not on blood thinners.     HPI  No past medical history on file.  There are no active problems to display for this patient.   Past Surgical History:  Procedure Laterality Date   MANDIBLE FRACTURE SURGERY          Home Medications    Prior to Admission medications   Medication Sig Start Date End Date Taking? Authorizing Provider  diphenhydrAMINE (BENADRYL) 25 mg capsule Take 25-50 mg by mouth at bedtime as needed for sleep.    [provider]  doxycycline (VIBRAMYCIN) 100 MG capsule Take 1 capsule (100 mg total) by mouth 2 (two) times daily for 7 days. 05/04/19 05/11/19  Priyansh Pry, PA-C  HYDROcodone-acetaminophen (NORCO/VICODIN) 5-325 MG tablet Take 1 tablet by mouth every 6 (six) hours as needed for severe pain. 08/22/16   Felicie MornSmith, David, NP  ibuprofen (ADVIL,MOTRIN) 600 MG tablet Take 1 tablet (600 mg total) by mouth every 6 (six) hours as needed. 08/04/13   Lurene ShadowPhelps, Erin O, PA-C  ibuprofen (ADVIL,MOTRIN) 800 MG tablet Take 1 tablet (800 mg total) by mouth every 8 (eight) hours as needed for mild pain or moderate pain. 03/17/14   Trixie DredgeWest, Emily, PA-C  oxyCODONE-acetaminophen (PERCOCET/ROXICET) 5-325 MG per tablet Take 2 tablets by mouth every 4 (four) hours as needed for severe pain. 05/10/15   Eyvonne MechanicHedges,  Jeffrey, PA-C    Family History No family history on file.  Social History Social History   Tobacco Use   Smoking status: Never Smoker   Smokeless tobacco: Never Used  Substance Use Topics   Alcohol use: Never    Frequency: Never   Drug use: No     Allergies   Patient has no known allergies.   Review of Systems Review of Systems  Constitutional: Negative for fever.  Gastrointestinal: Negative for nausea and vomiting.  Skin:       Lesion in abd     Physical Exam Updated Vital Signs BP 109/65 (BP Location: Right Arm)    Pulse 60    Resp 19    Ht 5\' 10"  (1.778 m)    Wt 77.1 kg    SpO2 99%    BMI 24.39 kg/m   Physical Exam Vitals signs and nursing note reviewed.  Constitutional:      General: He is not in acute distress.    Appearance: He is well-developed.     Comments: Appears nontoxic  HENT:     Head: Normocephalic and atraumatic.  Neck:     Musculoskeletal: Normal range of motion.  Pulmonary:     Effort: Pulmonary effort is normal.  Abdominal:     General: There is no distension.       Comments: 2 cm area or erythema and tenderness.  Central induration without active drainage.  No ttp elsewhere in the abd. No ridigity or distention. No signs of peritonitis.   Musculoskeletal: Normal range of motion.  Skin:    General: Skin is warm.     Findings: No rash.  Neurological:     Mental Status: He is alert and oriented to person, place, and time.      ED Treatments / Results  Labs (all labs ordered are listed, but only abnormal results are displayed) Labs Reviewed - No data to display  EKG None  Radiology No results found.  Procedures .Marland KitchenIncision and Drainage  Date/Time: 05/04/2019 3:06 PM Performed by: Franchot Heidelberg, PA-C Authorized by: Franchot Heidelberg, PA-C   Consent:    Consent obtained:  Verbal   Consent given by:  Patient   Risks discussed:  Bleeding, incomplete drainage, infection, damage to other organs and pain Location:      Type:  Abscess   Location:  Trunk   Trunk location:  Abdomen Pre-procedure details:    Skin preparation:  Chloraprep Anesthesia (see MAR for exact dosages):    Anesthesia method:  Local infiltration   Local anesthetic:  Lidocaine 2% WITH epi Procedure type:    Complexity:  Simple Procedure details:    Incision types:  Single straight   Incision depth:  Dermal   Scalpel blade:  11   Wound management:  Probed and deloculated and irrigated with saline   Drainage:  Bloody and purulent   Drainage amount:  Scant   Wound treatment:  Wound left open   Packing materials:  None Post-procedure details:    Patient tolerance of procedure:  Tolerated well, no immediate complications   (including critical care time)  Medications Ordered in ED Medications  lidocaine-EPINEPHrine (XYLOCAINE W/EPI) 2 %-1:200000 (PF) injection 10 mL (10 mLs Infiltration Given 05/04/19 1334)     Initial Impression / Assessment and Plan / ED Course  I have reviewed the triage vital signs and the nursing notes.  Pertinent labs & imaging results that were available during my care of the patient were reviewed by me and considered in my medical decision making (see chart for details).        Patient presenting today for evaluation of an abscess.  Physical exam consistent with abscess on the abdominal wall.  No signs of peritonitis.  No signs of systemic infection.  I&D performed as described above.  Discussed aftercare instructions.  Will place on abx for surrounding infection. At this time, pt appears safe for d/c. return precautions given. Pt states he understands and agrees to plan.   Final Clinical Impressions(s) / ED Diagnoses   Final diagnoses:  Abscess    ED Discharge Orders         Ordered    doxycycline (VIBRAMYCIN) 100 MG capsule  2 times daily     05/04/19 1429           Zula Hovsepian, PA-C 05/04/19 1508    Milton Ferguson, MD 05/05/19 [phone]

## 2024-08-23 ENCOUNTER — Other Ambulatory Visit: Payer: Self-pay

## 2024-08-23 ENCOUNTER — Emergency Department (HOSPITAL_COMMUNITY)
Admission: EM | Admit: 2024-08-23 | Discharge: 2024-08-23 | Disposition: A | Discharge status: Home / Self Care | Payer: Self-pay | Attending: Emergency Medicine | Admitting: Emergency Medicine

## 2024-08-23 DIAGNOSIS — H1032 Unspecified acute conjunctivitis, left eye: Secondary | ICD-10-CM | POA: Insufficient documentation

## 2024-08-23 DIAGNOSIS — W2109XA Struck by other hit or thrown ball, initial encounter: Secondary | ICD-10-CM | POA: Insufficient documentation

## 2024-08-23 MED ORDER — ERYTHROMYCIN 5 MG/GM OP OINT: TOPICAL_OINTMENT | OPHTHALMIC | 0 refills | Status: AC

## 2024-08-23 MED ORDER — ERYTHROMYCIN 5 MG/GM OP OINT: TOPICAL_OINTMENT | OPHTHALMIC | 0 refills | Status: DC

## 2024-08-23 MED ORDER — FLUORESCEIN SODIUM 1 MG OP STRP
1.0000 | ORAL_STRIP | Freq: Once | OPHTHALMIC | Status: AC
  Administered 2024-08-23: 1 via OPHTHALMIC
  Filled 2024-08-23: qty 1

## 2024-08-23 MED ORDER — ERYTHROMYCIN 5 MG/GM OP OINT
TOPICAL_OINTMENT | Freq: Once | OPHTHALMIC | Status: AC
  Filled 2024-08-23: qty 3.5

## 2024-08-23 MED ORDER — TETRACAINE HCL 0.5 % OP SOLN
1.0000 [drp] | Freq: Once | OPHTHALMIC | Status: AC
  Administered 2024-08-23: 1 [drp] via OPHTHALMIC
  Filled 2024-08-23: qty 4

## 2024-08-23 NOTE — ED Triage Notes (Signed)
 Pt ambulatory to triage with complaints of LEFT eye pain. Pt reports playing competitive ping pong, and was struck in the eye with a ball. Vision intact, but blurry. PT endorses photosensitivity.

## 2024-08-23 NOTE — Discharge Instructions (Addendum)
 Apply erythromycin  ointment 4 times per day to your eye.  You may use the tetracaine drops for today and tomorrow.  I recommend calling Dr. McCuen, an ophthalmologist, for follow-up appointment.  Return to the emergency room for increasing pain in your eye or trouble with seeing.

## 2024-08-23 NOTE — ED Provider Notes (Signed)
 [place] [facility] AT [facility] Provider Note   CSN: [id number] Arrival date & time: 08/23/24  1108     Patient presents with: Eye Pain   [name] is a 33 y.o. male.   This is a 33 year old male here today with eye pain.  Patient reports that 4 days ago he was struck in the eye with a ping-pong ball.  He reports that he had some pain at the time, however over the last couple of days started develop pain and redness in the eye, has a sensation of there being something in his eyes.   Eye Pain       Prior to Admission medications   Medication Sig Start Date End Date Taking? Authorizing Provider  erythromycin  ophthalmic ointment Place a 1/2 inch ribbon of ointment into the lower eyelid. 08/23/24  Yes Mannie Pac T, DO  diphenhydrAMINE (BENADRYL) 25 mg capsule Take 25-50 mg by mouth at bedtime as needed for sleep.    [provider]  HYDROcodone -acetaminophen  (NORCO/VICODIN) 5-325 MG tablet Take 1 tablet by mouth every 6 (six) hours as needed for severe pain. 08/22/16   Claudene Lenis, NP  ibuprofen  (ADVIL ,MOTRIN ) 600 MG tablet Take 1 tablet (600 mg total) by mouth every 6 (six) hours as needed. 08/04/13   Anitra Rocky KIDD, PA-C  ibuprofen  (ADVIL ,MOTRIN ) 800 MG tablet Take 1 tablet (800 mg total) by mouth every 8 (eight) hours as needed for mild pain or moderate pain. 03/17/14   Devora Perkins, PA-C  oxyCODONE -acetaminophen  (PERCOCET/ROXICET) 5-325 MG per tablet Take 2 tablets by mouth every 4 (four) hours as needed for severe pain. 05/10/15   Palmer Purchase, PA-C    Allergies: Patient has no known allergies.    Review of Systems  Eyes:  Positive for pain.    Updated Vital Signs BP 111/85 (BP Location: Right Arm)   Pulse (!) 59   Temp 98.1 F (36.7 C) (Oral)   Resp 16   SpO2 100%   Physical Exam Vitals and nursing note reviewed.  Eyes:     Extraocular Movements: Extraocular movements intact.     Pupils: Pupils are equal, round, and  reactive to light.     Comments: Redness of the left conjunctiva.  Vision grossly normal.  Fluorescein  stain applied, negative Sidel sign, no evidence of globe rupture.  No foreign body visualized, lids everted.  Intraocular pressure of 16 with a confidence of 90%.  Neurological:     General: No focal deficit present.     Mental Status: He is alert.     (all labs ordered are listed, but only abnormal results are displayed) Labs Reviewed - No data to display  EKG: None  Radiology: No results found.   Procedures   Medications Ordered in the ED  erythromycin  ophthalmic ointment (has no administration in time range)  fluorescein  ophthalmic strip 1 strip (1 strip Left Eye Given 08/23/24 1210)  tetracaine  (PONTOCAINE) 0.5 % ophthalmic solution 1 drop (1 drop Both Eyes Given 08/23/24 1210)                                    Medical Decision Making 33 year old male here today with eye pain.  Differential diagnoses include conjunctivitis, traumatic iritis, less likely angle-closure glaucoma.  Plan -I placed the tetracaine  drops, patient's eye pain immediately resolved.  This appears to be a conjunctivitis, possibly related to a dirty pimple striking his  eye.  I did consider traumatic iritis, however given the patient's immediate improvement with tetracaine I believe this is less likely.  His intraocular pressure was normal.  Patient also to sensation of grittiness in his eyes which does lean closer towards conjunctivitis.  I have provided some erythromycin  ointment, have sent some to his pharmacy.  Will discharge.  Risk Prescription drug management.        Final diagnoses:  Acute conjunctivitis of left eye, unspecified acute conjunctivitis type    ED Discharge Orders          Ordered    erythromycin  ophthalmic ointment        08/23/24 1239               Mannie Pac T, DO 08/23/24 1240
